# Patient Record
Sex: Female | Born: 1965 | Hispanic: No | Marital: Married | Smoking: Never smoker
Health system: Southern US, Community
[De-identification: ages and names within clinical notes are randomized; demographics above are authoritative.]

## PROBLEM LIST (undated history)

## (undated) DIAGNOSIS — M1990 Unspecified osteoarthritis, unspecified site: Secondary | ICD-10-CM

## (undated) DIAGNOSIS — I10 Essential (primary) hypertension: Secondary | ICD-10-CM

## (undated) HISTORY — PX: BREAST SURGERY: SHX581

---

## 2002-08-21 ENCOUNTER — Other Ambulatory Visit: Admission: RE | Admit: 2002-08-21 | Discharge: 2002-08-21 | Payer: Self-pay | Admitting: Internal Medicine

## 2002-11-27 ENCOUNTER — Encounter: Payer: Self-pay | Admitting: Family Medicine

## 2002-11-27 ENCOUNTER — Encounter: Admission: RE | Admit: 2002-11-27 | Discharge: 2002-11-27 | Payer: Self-pay | Admitting: Family Medicine

## 2003-02-23 ENCOUNTER — Encounter: Admission: RE | Admit: 2003-02-23 | Discharge: 2003-02-23 | Payer: Self-pay | Admitting: Family Medicine

## 2009-03-28 ENCOUNTER — Emergency Department (HOSPITAL_BASED_OUTPATIENT_CLINIC_OR_DEPARTMENT_OTHER): Admission: EM | Admit: 2009-03-28 | Discharge: 2009-03-28 | Payer: Self-pay | Admitting: Emergency Medicine

## 2010-02-26 ENCOUNTER — Encounter: Payer: Self-pay | Admitting: Family Medicine

## 2010-04-29 LAB — RAPID STREP SCREEN (MED CTR MEBANE ONLY): Streptococcus, Group A Screen (Direct): POSITIVE — AB

## 2010-05-30 ENCOUNTER — Other Ambulatory Visit: Payer: Self-pay | Admitting: Family Medicine

## 2010-05-30 ENCOUNTER — Other Ambulatory Visit (HOSPITAL_COMMUNITY)
Admission: RE | Admit: 2010-05-30 | Discharge: 2010-05-30 | Disposition: A | Discharge status: Home / Self Care | Payer: 59 | Source: Ambulatory Visit | Attending: Family Medicine | Admitting: Family Medicine

## 2010-05-30 DIAGNOSIS — Z124 Encounter for screening for malignant neoplasm of cervix: Secondary | ICD-10-CM | POA: Insufficient documentation

## 2010-05-30 DIAGNOSIS — Z1159 Encounter for screening for other viral diseases: Secondary | ICD-10-CM | POA: Insufficient documentation

## 2013-10-22 ENCOUNTER — Other Ambulatory Visit: Payer: Self-pay

## 2013-10-22 DIAGNOSIS — Z1231 Encounter for screening mammogram for malignant neoplasm of breast: Secondary | ICD-10-CM

## 2013-10-22 DIAGNOSIS — Z9889 Other specified postprocedural states: Secondary | ICD-10-CM

## 2013-10-23 ENCOUNTER — Ambulatory Visit
Admission: RE | Admit: 2013-10-23 | Discharge: 2013-10-23 | Disposition: A | Discharge status: Home / Self Care | Payer: 59 | Source: Ambulatory Visit

## 2013-10-23 DIAGNOSIS — Z9889 Other specified postprocedural states: Secondary | ICD-10-CM

## 2013-10-23 DIAGNOSIS — Z1231 Encounter for screening mammogram for malignant neoplasm of breast: Secondary | ICD-10-CM

## 2013-10-28 ENCOUNTER — Other Ambulatory Visit: Payer: Self-pay | Admitting: Family Medicine

## 2013-10-28 DIAGNOSIS — R928 Other abnormal and inconclusive findings on diagnostic imaging of breast: Secondary | ICD-10-CM

## 2013-11-07 ENCOUNTER — Ambulatory Visit
Admission: RE | Admit: 2013-11-07 | Discharge: 2013-11-07 | Disposition: A | Discharge status: Home / Self Care | Payer: 59 | Source: Ambulatory Visit | Attending: Family Medicine | Admitting: Family Medicine

## 2013-11-07 DIAGNOSIS — R928 Other abnormal and inconclusive findings on diagnostic imaging of breast: Secondary | ICD-10-CM

## 2015-03-14 IMAGING — US US BREAST*L* LIMITED INC AXILLA
1 series · 4 of 4 positions shown · non-contrast
Comparison: 10/23/2013

CLINICAL DATA: Possible mass left breast identified on recent
screening mammogram. Prior to 10/23/2013 the patient was present
mammogram was over 10 years ago and is no longer available for
comparison. History of bilateral breast reduction in 1888.

EXAM:
DIGITAL DIAGNOSTIC  LEFT MAMMOGRAM
ULTRASOUND LEFT BREAST

[Series 1: superficial breast · 4 of 4 slices shown]
[im 1/4]
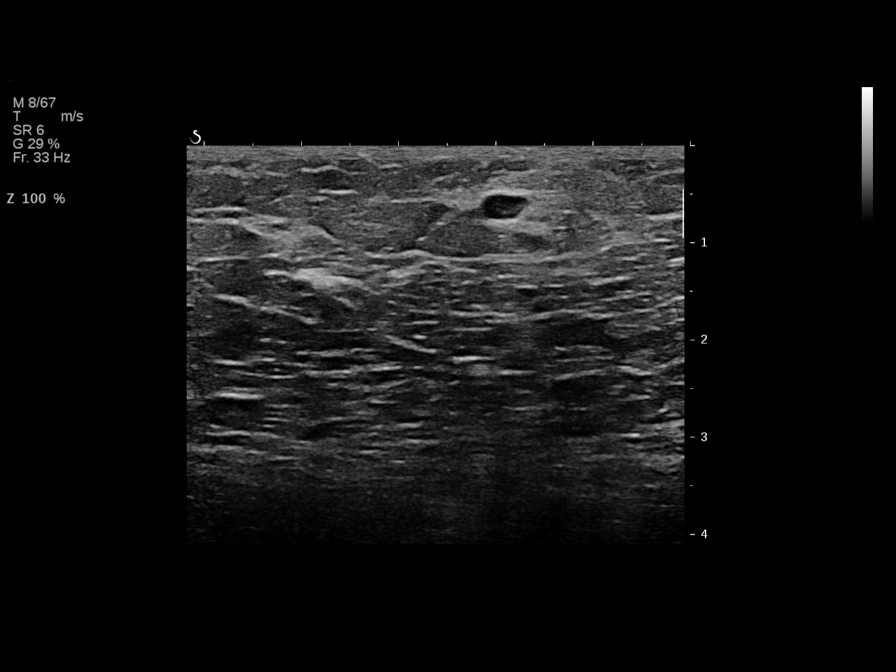
[im 2/4]
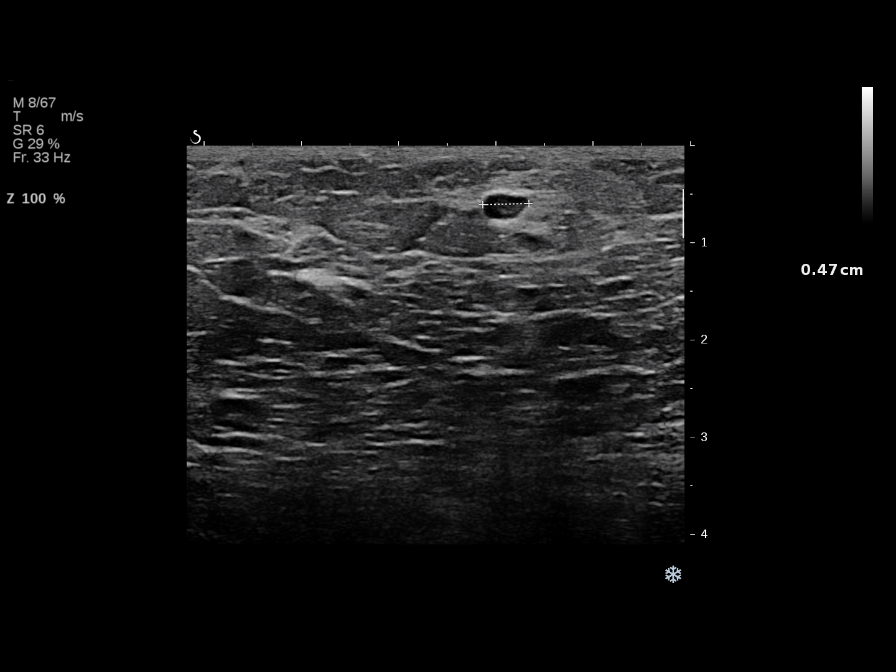
[im 3/4]
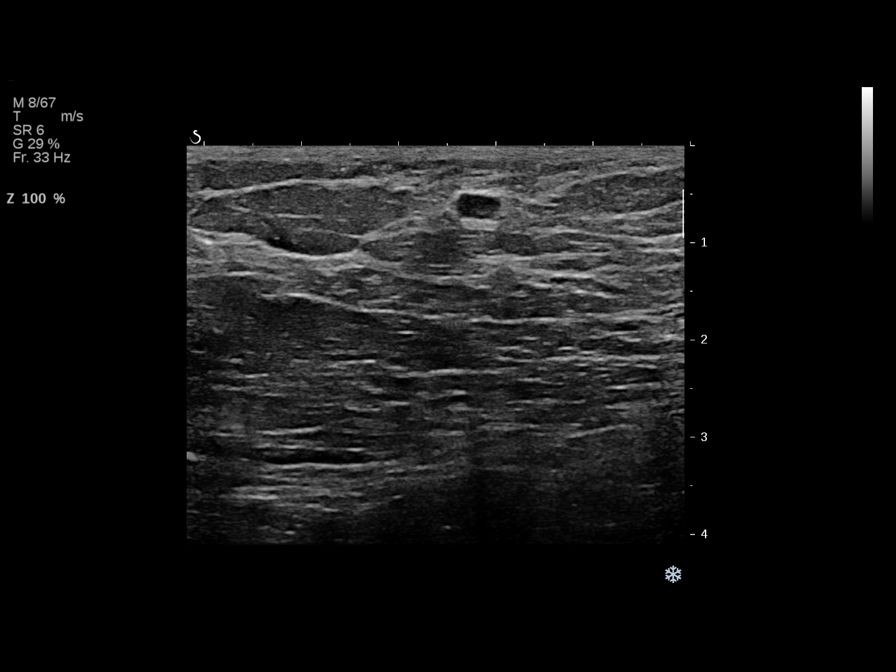
[im 4/4]
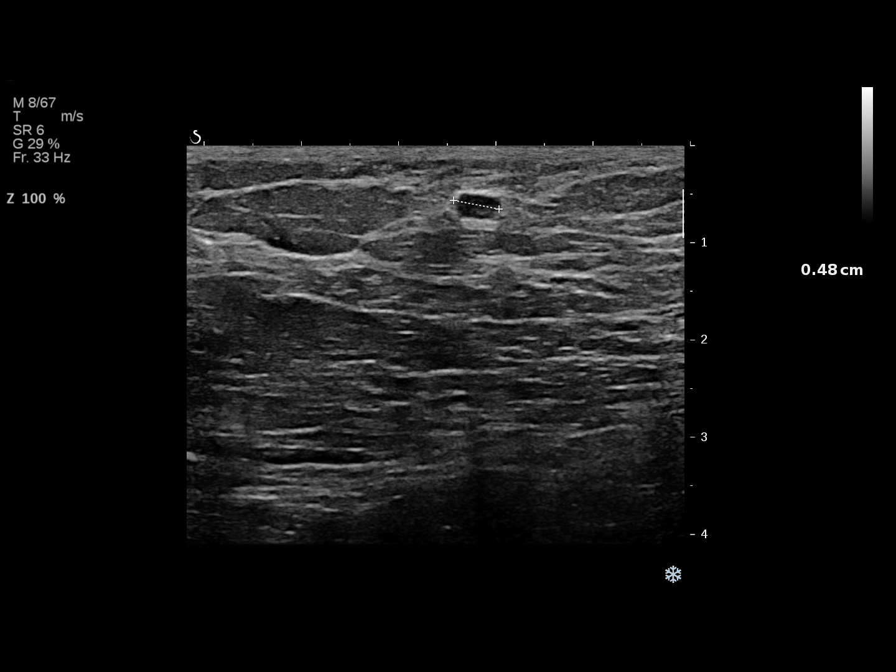

[4 of 4 positions shown; findings below may reference images not displayed]

ACR Breast Density Category b: There are scattered areas of
fibroglandular density.
FINDINGS: Focal spot compression views of the outer left breast show a
probable island of breast tissue. Small benign intramammary lymph
node is noted in this region.

On physical exam, no mass is palpated in the outer left breast.

Ultrasound is performed, showing normal predominately fatty breast
parenchyma in the outer left breast. No suspicious mass is seen. 5
mm intramammary lymph node is noted in the 3 o'clock region of the
left breast approximately 9 cm from the nipple.
IMPRESSION: No evidence of malignancy in the left breast. Benign intramammary
lymph node noted.

RECOMMENDATION:
Screening mammogram in one year.(Code:C1-O-DVS)

I have discussed the findings and recommendations with the patient.
Results were also provided in writing at the conclusion of the
visit. If applicable, a reminder letter will be sent to the patient
regarding the next appointment.

BI-RADS CATEGORY  2: Benign.

## 2015-03-14 IMAGING — MG MM DIGITAL DIAGNOSTIC UNILAT L
2 series · 2 of 2 positions shown · non-contrast
Comparison: 10/23/2013

CLINICAL DATA: Possible mass left breast identified on recent
screening mammogram. Prior to 10/23/2013 the patient was present
mammogram was over 10 years ago and is no longer available for
comparison. History of bilateral breast reduction in 1888.

EXAM:
DIGITAL DIAGNOSTIC  LEFT MAMMOGRAM
ULTRASOUND LEFT BREAST

[L MLO]
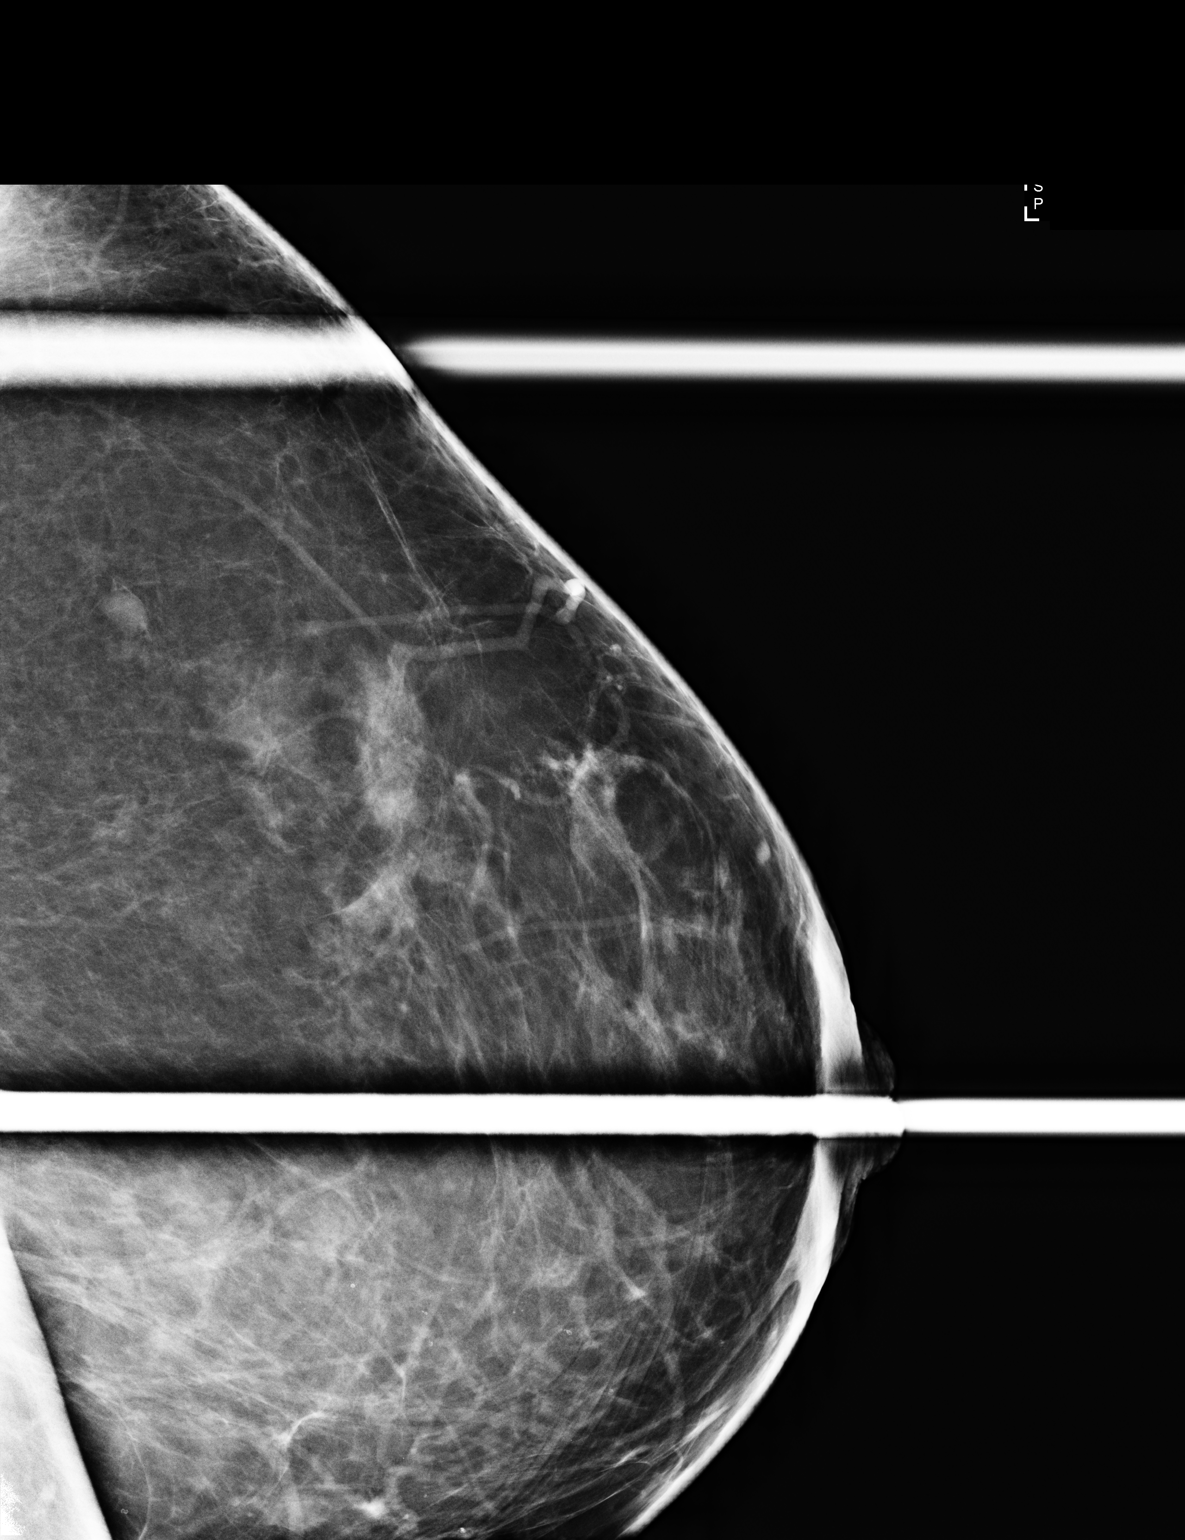

[L CC]
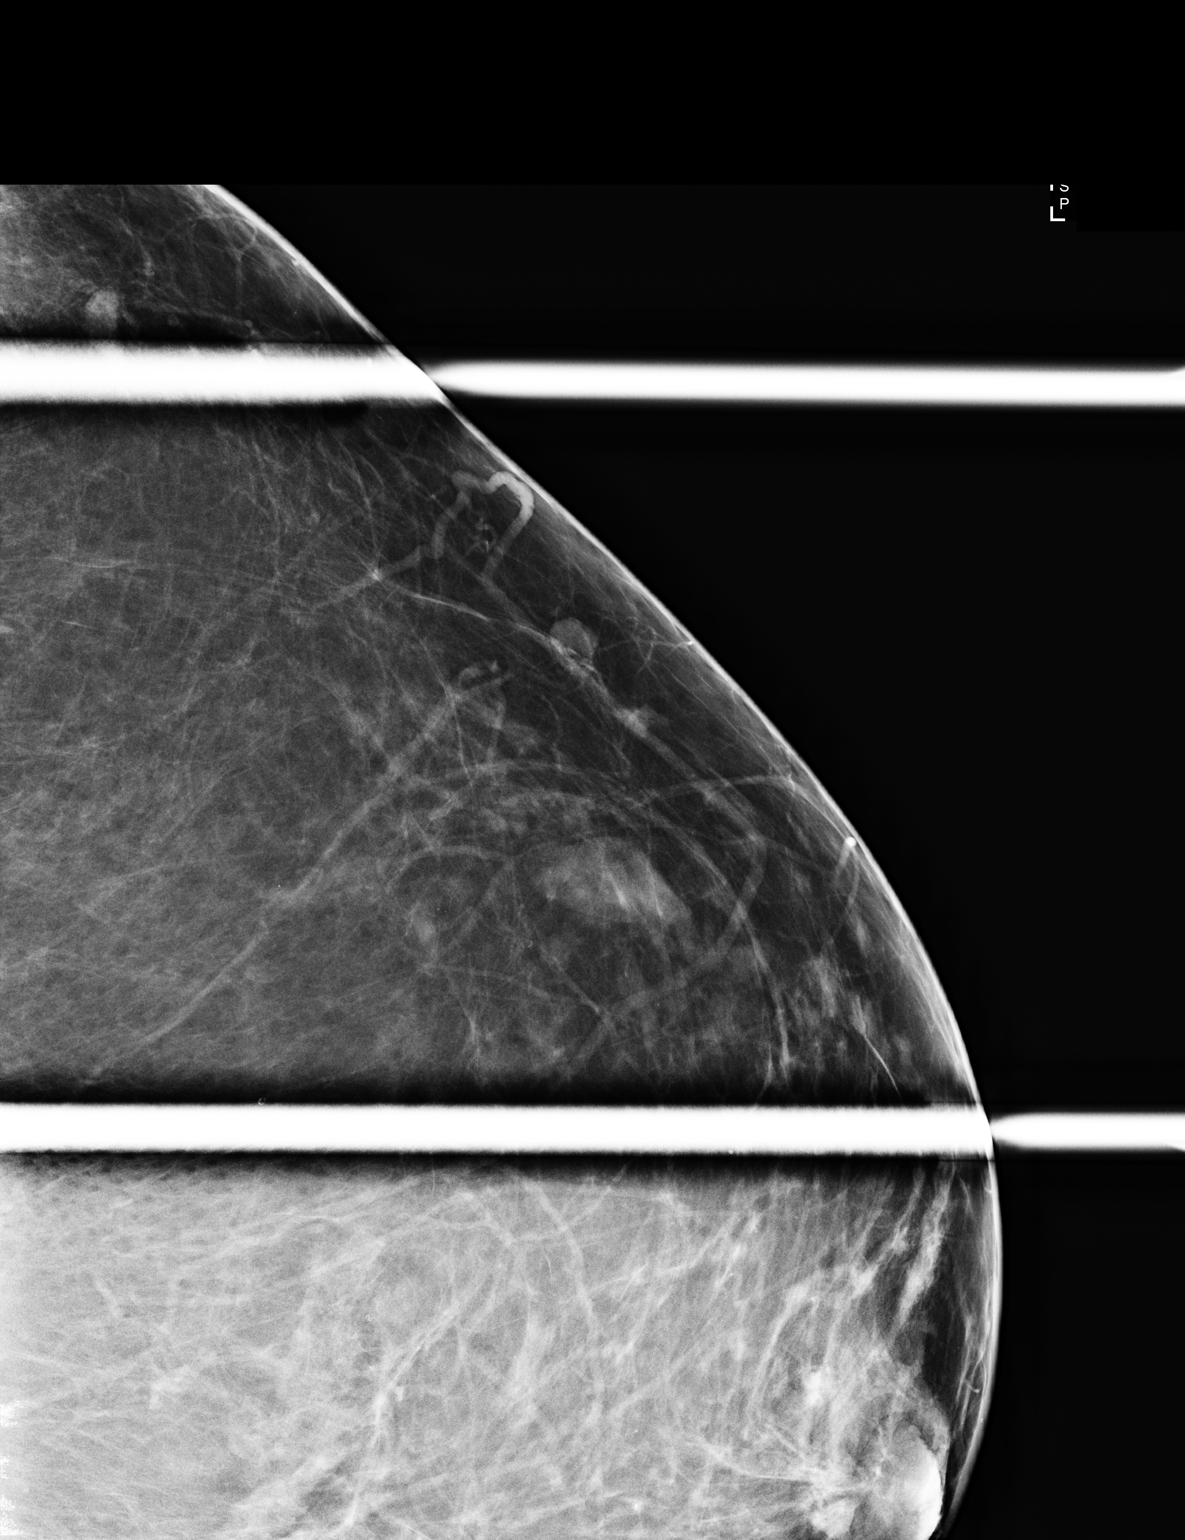

[2 of 2 positions shown; findings below may reference images not displayed]

ACR Breast Density Category b: There are scattered areas of
fibroglandular density.
FINDINGS: Focal spot compression views of the outer left breast show a
probable island of breast tissue. Small benign intramammary lymph
node is noted in this region.

On physical exam, no mass is palpated in the outer left breast.

Ultrasound is performed, showing normal predominately fatty breast
parenchyma in the outer left breast. No suspicious mass is seen. 5
mm intramammary lymph node is noted in the 3 o'clock region of the
left breast approximately 9 cm from the nipple.
IMPRESSION: No evidence of malignancy in the left breast. Benign intramammary
lymph node noted.

RECOMMENDATION:
Screening mammogram in one year.(Code:C1-O-DVS)

I have discussed the findings and recommendations with the patient.
Results were also provided in writing at the conclusion of the
visit. If applicable, a reminder letter will be sent to the patient
regarding the next appointment.

BI-RADS CATEGORY  2: Benign.

## 2018-07-25 ENCOUNTER — Ambulatory Visit: Payer: Self-pay | Admitting: Family Medicine

## 2018-07-25 NOTE — Progress Notes (Deleted)
  Katrina Harris - 53 y.o. female MRN [id number]  Date of birth: [birth year]  SUBJECTIVE:  Including CC & ROS.  No chief complaint on file.   Katrina Harris is a 53 y.o. female that is  ***.  ***   Review of Systems  HISTORY: Past Medical, Surgical, Social, and Family History Reviewed & Updated per EMR.   Pertinent Historical Findings include:  No past medical history on file.  *** The histories are not reviewed yet. Please review them in the "History" navigator section and refresh this [place].  Not on File  No family history on file.   Social History   Socioeconomic History  . Marital status: Married    Spouse name: Not on file  . Number of children: Not on file  . Years of education: Not on file  . Highest education level: Not on file  Occupational History  . Not on file  Social Needs  . Financial resource strain: Not on file  . Food insecurity    Worry: Not on file    Inability: Not on file  . Transportation needs    Medical: Not on file    Non-medical: Not on file  Tobacco Use  . Smoking status: Not on file  Substance and Sexual Activity  . Alcohol use: Not on file  . Drug use: Not on file  . Sexual activity: Not on file  Lifestyle  . Physical activity    Days per week: Not on file    Minutes per session: Not on file  . Stress: Not on file  Relationships  . Social Herbalist on phone: Not on file    Gets together: Not on file    Attends religious service: Not on file    Active member of club or organization: Not on file    Attends meetings of clubs or organizations: Not on file    Relationship status: Not on file  . Intimate partner violence    Fear of current or ex partner: Not on file    Emotionally abused: Not on file    Physically abused: Not on file    Forced sexual activity: Not on file  Other Topics Concern  . Not on file  Social History Narrative  . Not on file     PHYSICAL EXAM:  VS: There were no vitals taken for this visit.  Physical Exam Gen: NAD, alert, cooperative with exam, well-appearing ENT: normal lips, normal nasal mucosa,  Eye: normal EOM, normal conjunctiva and lids CV:  no edema, +2 pedal pulses   Resp: no accessory muscle use, non-labored,  GI: no masses or tenderness, no hernia  Skin: no rashes, no areas of induration  Neuro: normal tone, normal sensation to touch Psych:  normal insight, alert and oriented MSK:  ***      ASSESSMENT & PLAN:   No problem-specific Assessment & Plan notes found for this encounter.

## 2018-07-26 ENCOUNTER — Encounter: Payer: Self-pay | Admitting: Orthopedic Surgery

## 2018-07-26 ENCOUNTER — Ambulatory Visit: Payer: Self-pay

## 2018-07-26 ENCOUNTER — Other Ambulatory Visit: Payer: Self-pay

## 2018-07-26 ENCOUNTER — Ambulatory Visit (INDEPENDENT_AMBULATORY_CARE_PROVIDER_SITE_OTHER): Payer: No Typology Code available for payment source | Admitting: Orthopedic Surgery

## 2018-07-26 DIAGNOSIS — M25562 Pain in left knee: Secondary | ICD-10-CM | POA: Diagnosis not present

## 2018-07-26 DIAGNOSIS — M1712 Unilateral primary osteoarthritis, left knee: Secondary | ICD-10-CM | POA: Diagnosis not present

## 2018-07-26 NOTE — Progress Notes (Signed)
Office Visit Note   Patient: Katrina CousinsRose Harris           Date of Birth: 08/13/1965           MRN: [id number] Visit Date: 07/26/2018 Requested by: No referring provider defined for this encounter. PCP: Patient, No Pcp Per  Subjective: Chief Complaint  Patient presents with  . Left Knee - Pain    HPI: Katrina DupreRose is a patient with left knee pain.  She has had pain since January but is been worse of the last week.  Reports some locking and popping and it will occasionally wake her from sleep at night.  She went to her primary care provider and the radiographs showed osteoarthritis.  That is confirmed on her radiographs from today.  An injection was tried without much relief.  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  Been taking Tylenol and Aleve for the problem.  She has been walking recently for exercise.  She likes to do the elliptical at the gym.  Right knee is doing well.              ROS: All systems reviewed are negative as they relate to the chief complaint within the history of present illness.  Patient denies  fevers or chills.   Assessment & Plan: Visit Diagnoses:  1. Left knee pain, unspecified chronicity   2. Arthritis of left knee     Plan: Impression is left knee medial compartment arthritis with generally global pain.  She has tried a cortisone injection without much relief.  I would favor preapproval for gel injection.  I think she will need knee replacement at sometime in the future but she is on the young side and I think it will be likely at least a year or 2.  Non-loadbearing quad strengthening exercises such as stationary bike as well as leg extension exercises are discussed.  All questions answered.  I will see her back in about 2 or 3 weeks for the gel injection.  This patient is diagnosed with osteoarthritis of the knee(s).    Radiographs show evidence of joint space narrowing, osteophytes, subchondral sclerosis and/or subchondral cysts.  This patient has knee pain  which interferes with functional and activities of daily living.    This patient has experienced inadequate response, adverse effects and/or intolerance with conservative treatments such as acetaminophen, NSAIDS, topical creams, physical therapy or regular exercise, knee bracing and/or weight loss.   This patient has experienced inadequate response or has a contraindication to intra articular steroid injections for at least 3 months.   This patient is not scheduled to have a total knee replacement within 6 months of starting treatment with viscosupplementation.   Follow-Up Instructions: No follow-ups on file.   Orders:  Orders Placed This Encounter  Procedures  . XR Knee 1-2 Views Left   No orders of the defined types were placed in this encounter.     Procedures: No procedures performed   Clinical Data: No additional findings.  Objective: Vital Signs: There were no vitals taken for this visit.  Physical Exam:   Constitutional: Patient appears well-developed HEENT:  Head: Normocephalic Eyes:EOM are normal Neck: Normal range of motion Cardiovascular: Normal rate Pulmonary/chest: Effort normal Neurologic: Patient is alert Skin: Skin is warm Psychiatric: Patient has normal mood and affect    Ortho Exam: Ortho exam demonstrates normal gait alignment.  Patient does have an effusion in the left knee no effusion right knee.  Collateral crucial ligaments are stable  in that left knee.  Extensor mechanism is intact and she has full extension to about 115 degrees.  Pedal pulses palpable.  No groin pain with internal X rotation of the leg.  Fairly mild patellofemoral crepitus bilaterally. Specialty Comments:  No specialty comments available.  Imaging: No results found.   PMFS History: There are no active problems to display for this patient.  History reviewed. No pertinent past medical history.  History reviewed. No pertinent family history.  History reviewed. No  pertinent surgical history. Social History   Occupational History  . Not on file  Tobacco Use  . Smoking status: Not on file  Substance and Sexual Activity  . Alcohol use: Not on file  . Drug use: Not on file  . Sexual activity: Not on file

## 2018-07-29 ENCOUNTER — Telehealth: Payer: Self-pay | Admitting: Orthopedic Surgery

## 2018-07-29 NOTE — Telephone Encounter (Signed)
Please advise thanks.

## 2018-07-29 NOTE — Telephone Encounter (Signed)
Patient called asked if she can get something stronger for the  Pain in her left knee. The number to contact patient is [phone] or [phone]

## 2018-07-30 ENCOUNTER — Telehealth: Payer: Self-pay

## 2018-07-30 MED ORDER — TRAMADOL HCL 50 MG PO TABS: ORAL_TABLET | ORAL | 0 refills | Status: DC

## 2018-07-30 NOTE — Telephone Encounter (Signed)
Called to pharmacy. Patient advised done.  

## 2018-07-30 NOTE — Telephone Encounter (Signed)
Need prior auth for gel injections

## 2018-07-30 NOTE — Telephone Encounter (Signed)
Oik for tramadol 1 po q 8  - 12 # 45 pls call thc

## 2018-08-02 NOTE — Telephone Encounter (Signed)
Printed demographics for submission.  

## 2018-08-14 ENCOUNTER — Telehealth: Payer: Self-pay

## 2018-08-14 NOTE — Telephone Encounter (Signed)
Submitted for pharmacy benefits due to not being able to buy & bill for Durolane, left knee.

## 2018-08-19 ENCOUNTER — Telehealth: Payer: Self-pay | Admitting: Orthopedic Surgery

## 2018-08-19 ENCOUNTER — Telehealth: Payer: Self-pay

## 2018-08-19 NOTE — Telephone Encounter (Signed)
Patient left vm. I called her back in regards to the gell injection and the status of it. Read April J notes and advised she has tried to seek pymt for the inj through pharmacy and not covered on 7/8. Advised today she tried per notes through Medical benefits. Advised patient someone will reach out to her as soon as we hear something back.  Patient understood.

## 2018-08-19 NOTE — Telephone Encounter (Signed)
Submitted for Euflexxa, left knee due to injection not being covered by pharmacy benefits for Durolane.

## 2018-08-22 ENCOUNTER — Telehealth: Payer: Self-pay

## 2018-08-22 NOTE — Telephone Encounter (Signed)
Called and left a VM for patient to CB to schedule an appointment with Dr. Marlou Sa for gel injection.  Approved for Euflexxa series, left knee. [place] This is Out of Network, but is covered at 100% of the allowable amount. Co-pay of $160.00 required No PA required  Called to verify that Dr. Marlou Sa was Out of Network.  Per [place] with [employer] through [facility], Dr. Marlou Sa is out of Network.

## 2018-09-02 ENCOUNTER — Ambulatory Visit (INDEPENDENT_AMBULATORY_CARE_PROVIDER_SITE_OTHER): Payer: No Typology Code available for payment source | Admitting: Orthopedic Surgery

## 2018-09-02 ENCOUNTER — Encounter: Payer: Self-pay | Admitting: Orthopedic Surgery

## 2018-09-02 DIAGNOSIS — M1712 Unilateral primary osteoarthritis, left knee: Secondary | ICD-10-CM

## 2018-09-05 ENCOUNTER — Encounter: Payer: Self-pay | Admitting: Orthopedic Surgery

## 2018-09-05 DIAGNOSIS — M1712 Unilateral primary osteoarthritis, left knee: Secondary | ICD-10-CM | POA: Diagnosis not present

## 2018-09-05 MED ORDER — SODIUM HYALURONATE (VISCOSUP) 20 MG/2ML IX SOSY
20.0000 mg | PREFILLED_SYRINGE | INTRA_ARTICULAR | Status: AC | PRN
  Administered 2018-09-05: 20 mg via INTRA_ARTICULAR

## 2018-09-05 MED ORDER — LIDOCAINE HCL 1 % IJ SOLN
5.0000 mL | INTRAMUSCULAR | Status: AC | PRN
  Administered 2018-09-05: 5 mL

## 2018-09-05 NOTE — Progress Notes (Signed)
   Procedure Note  Patient: Katrina Harris             Date of Birth: Jan 04, 1966           MRN: [id number]             Visit Date: 09/02/2018  Procedures: Visit Diagnoses:  1. Arthritis of left knee     Large Joint Inj: L knee on 09/05/2018 10:49 PM Indications: pain, joint swelling and diagnostic evaluation Details: 18 G 1.5 in needle, superolateral approach  Arthrogram: No  Medications: 5 mL lidocaine 1 %; 20 mg Sodium Hyaluronate 20 MG/2ML Outcome: tolerated well, no immediate complications Procedure, treatment alternatives, risks and benefits explained, specific risks discussed. Consent was given by the patient. Immediately prior to procedure a time out was called to verify the correct patient, procedure, equipment, support staff and site/side marked as required. Patient was prepped and draped in the usual sterile fashion.     Lurena Nida This patient is diagnosed with osteoarthritis of the knee(s).    Radiographs show evidence of joint space narrowing, osteophytes, subchondral sclerosis and/or subchondral cysts.  This patient has knee pain which interferes with functional and activities of daily living.    This patient has experienced inadequate response, adverse effects and/or intolerance with conservative treatments such as acetaminophen, NSAIDS, topical creams, physical therapy or regular exercise, knee bracing and/or weight loss.   This patient has experienced inadequate response or has a contraindication to intra articular steroid injections for at least 3 months.   This patient is not scheduled to have a total knee replacement within 6 months of starting treatment with viscosupplementation.

## 2018-09-11 ENCOUNTER — Other Ambulatory Visit: Payer: Self-pay

## 2018-09-11 ENCOUNTER — Ambulatory Visit (INDEPENDENT_AMBULATORY_CARE_PROVIDER_SITE_OTHER): Payer: No Typology Code available for payment source | Admitting: Orthopedic Surgery

## 2018-09-11 ENCOUNTER — Encounter: Payer: Self-pay | Admitting: Orthopedic Surgery

## 2018-09-11 DIAGNOSIS — M1712 Unilateral primary osteoarthritis, left knee: Secondary | ICD-10-CM | POA: Diagnosis not present

## 2018-09-11 MED ORDER — SODIUM HYALURONATE (VISCOSUP) 20 MG/2ML IX SOSY
20.0000 mg | PREFILLED_SYRINGE | INTRA_ARTICULAR | Status: AC | PRN
  Administered 2018-09-11: 20 mg via INTRA_ARTICULAR

## 2018-09-11 MED ORDER — LIDOCAINE HCL 1 % IJ SOLN
5.0000 mL | INTRAMUSCULAR | Status: AC | PRN
  Administered 2018-09-11: 5 mL

## 2018-09-11 NOTE — Progress Notes (Signed)
   Procedure Note  Patient: Wrenna Saks             Date of Birth: 27-May-1965           MRN: [id number]             Visit Date: 09/11/2018  Procedures: Visit Diagnoses:  1. Arthritis of left knee     Large Joint Inj: L knee on 09/11/2018 4:52 PM Indications: pain, joint swelling and diagnostic evaluation Details: 18 G 1.5 in needle, superolateral approach  Arthrogram: No  Medications: 5 mL lidocaine 1 %; 20 mg Sodium Hyaluronate 20 MG/2ML Outcome: tolerated well, no immediate complications Procedure, treatment alternatives, risks and benefits explained, specific risks discussed. Consent was given by the patient. Immediately prior to procedure a time out was called to verify the correct patient, procedure, equipment, support staff and site/side marked as required. Patient was prepped and draped in the usual sterile fashion.

## 2018-09-18 ENCOUNTER — Encounter: Payer: Self-pay | Admitting: Orthopedic Surgery

## 2018-09-18 ENCOUNTER — Other Ambulatory Visit: Payer: Self-pay

## 2018-09-18 ENCOUNTER — Ambulatory Visit (INDEPENDENT_AMBULATORY_CARE_PROVIDER_SITE_OTHER): Payer: No Typology Code available for payment source | Admitting: Orthopedic Surgery

## 2018-09-18 DIAGNOSIS — M1712 Unilateral primary osteoarthritis, left knee: Secondary | ICD-10-CM

## 2018-09-20 ENCOUNTER — Encounter: Payer: Self-pay | Admitting: Orthopedic Surgery

## 2018-09-20 DIAGNOSIS — M1712 Unilateral primary osteoarthritis, left knee: Secondary | ICD-10-CM | POA: Diagnosis not present

## 2018-09-20 MED ORDER — SODIUM HYALURONATE (VISCOSUP) 20 MG/2ML IX SOSY
20.0000 mg | PREFILLED_SYRINGE | INTRA_ARTICULAR | Status: AC | PRN
  Administered 2018-09-20: 18:00:00 20 mg via INTRA_ARTICULAR

## 2018-09-20 MED ORDER — LIDOCAINE HCL 1 % IJ SOLN
5.0000 mL | INTRAMUSCULAR | Status: AC | PRN
  Administered 2018-09-20: 5 mL

## 2018-09-20 NOTE — Progress Notes (Signed)
   Procedure Note  Patient: Katrina Harris             Date of Birth: 1965/08/09           MRN: [id number]             Visit Date: 09/18/2018  Procedures: Visit Diagnoses: No diagnosis found.  Large Joint Inj: L knee on 09/20/2018 6:19 PM Indications: pain, joint swelling and diagnostic evaluation Details: 18 G 1.5 in needle, superolateral approach  Arthrogram: No  Medications: 5 mL lidocaine 1 %; 20 mg Sodium Hyaluronate 20 MG/2ML Outcome: tolerated well, no immediate complications Procedure, treatment alternatives, risks and benefits explained, specific risks discussed. Consent was given by the patient. Immediately prior to procedure a time out was called to verify the correct patient, procedure, equipment, support staff and site/side marked as required. Patient was prepped and draped in the usual sterile fashion.

## 2019-03-24 ENCOUNTER — Other Ambulatory Visit: Payer: Self-pay | Admitting: Orthopedic Surgery

## 2019-03-24 NOTE — Telephone Encounter (Signed)
Ok to rf? 

## 2019-09-20 ENCOUNTER — Emergency Department (HOSPITAL_BASED_OUTPATIENT_CLINIC_OR_DEPARTMENT_OTHER): Payer: No Typology Code available for payment source

## 2019-09-20 ENCOUNTER — Other Ambulatory Visit: Payer: Self-pay

## 2019-09-20 ENCOUNTER — Encounter (HOSPITAL_BASED_OUTPATIENT_CLINIC_OR_DEPARTMENT_OTHER): Payer: Self-pay | Admitting: Emergency Medicine

## 2019-09-20 ENCOUNTER — Emergency Department (HOSPITAL_BASED_OUTPATIENT_CLINIC_OR_DEPARTMENT_OTHER)
Admission: EM | Admit: 2019-09-20 | Discharge: 2019-09-20 | Disposition: A | Discharge status: Home / Self Care | Payer: No Typology Code available for payment source | Attending: Emergency Medicine | Admitting: Emergency Medicine

## 2019-09-20 DIAGNOSIS — R059 Cough, unspecified: Secondary | ICD-10-CM

## 2019-09-20 DIAGNOSIS — J189 Pneumonia, unspecified organism: Secondary | ICD-10-CM | POA: Diagnosis not present

## 2019-09-20 DIAGNOSIS — Z79899 Other long term (current) drug therapy: Secondary | ICD-10-CM | POA: Diagnosis not present

## 2019-09-20 DIAGNOSIS — Z20822 Contact with and (suspected) exposure to covid-19: Secondary | ICD-10-CM | POA: Insufficient documentation

## 2019-09-20 DIAGNOSIS — R05 Cough: Secondary | ICD-10-CM

## 2019-09-20 LAB — GROUP A STREP BY PCR: Group A Strep by PCR: NOT DETECTED

## 2019-09-20 LAB — SARS CORONAVIRUS 2 BY RT PCR (HOSPITAL ORDER, PERFORMED IN ~~LOC~~ HOSPITAL LAB): SARS Coronavirus 2: NEGATIVE

## 2019-09-20 MED ORDER — BENZONATATE 100 MG PO CAPS
100.0000 mg | ORAL_CAPSULE | Freq: Three times a day (TID) | ORAL | 0 refills | Status: DC
Start: 2019-09-20 — End: 2020-02-08

## 2019-09-20 MED ORDER — AZITHROMYCIN 250 MG PO TABS
ORAL_TABLET | ORAL | 0 refills | Status: DC
Start: 2019-09-20 — End: 2020-02-08

## 2019-09-20 NOTE — ED Triage Notes (Signed)
Cough and sore throat x 4 days. Cough is keeping her up at night.

## 2019-09-20 NOTE — ED Provider Notes (Signed)
[facility] EMERGENCY DEPARTMENT Provider Note  CSN: [id number] Arrival date & time: 09/20/19 1210    History Chief Complaint  Patient presents with  . Cough  . Sore Throat    HPI  Katrina Harris is a 54 y.o. female reports 4 days of scratchy throat and dry cough. She has not had a fever. Daughter has had similar symptoms and tested negative for Covid. Patient denies any fever, no N/V/D. She has had one Covid vaccine, due for second in 2 days. She has been taking OTC meds with minimal improvement. Having trouble sleeping at night.    History reviewed. No pertinent past medical history.  History reviewed. No pertinent surgical history.  No family history on file.  Social History   Tobacco Use  . Smoking status: Unknown If Ever Smoked  . Smokeless tobacco: Never Used  Substance Use Topics  . Alcohol use: Not on file  . Drug use: Not on file     Home Medications Prior to Admission medications   Medication Sig Start Date End Date Taking? Authorizing Provider  azithromycin (ZITHROMAX) 250 MG tablet Z-pak Use as directed 09/20/19   Pollyann Savoy, MD  benzonatate (TESSALON) 100 MG capsule Take 1 capsule (100 mg total) by mouth every 8 (eight) hours. 09/20/19   Pollyann Savoy, MD  traMADol Janean Sark) 50 MG tablet 1 po q 8-12 hrs prn pain 07/30/18   Cammy Copa, MD     Allergies    Penicillins   Review of Systems   Review of Systems A comprehensive review of systems was completed and negative except as noted in HPI.    Physical Exam BP (!) 165/96 (BP Location: Left Arm)   Pulse 77   Temp 99.6 F (37.6 C) (Oral)   Resp 16   Ht 5\' 3"  (1.6 m)   Wt 114.8 kg   SpO2 97%   BMI 44.82 kg/m   Physical Exam Vitals and nursing note reviewed.  Constitutional:      Appearance: Normal appearance.  HENT:     Head: Normocephalic and atraumatic.     Nose: Nose normal.     Mouth/Throat:     Mouth: Mucous membranes are moist.     Pharynx: No  pharyngeal swelling or posterior oropharyngeal erythema.     Tonsils: No tonsillar exudate.  Eyes:     Extraocular Movements: Extraocular movements intact.     Conjunctiva/sclera: Conjunctivae normal.  Cardiovascular:     Rate and Rhythm: Normal rate.  Pulmonary:     Effort: Pulmonary effort is normal.     Breath sounds: Normal breath sounds.  Abdominal:     General: Abdomen is flat.     Palpations: Abdomen is soft.     Tenderness: There is no abdominal tenderness.  Musculoskeletal:        General: No swelling. Normal range of motion.     Cervical back: Neck supple.  Skin:    General: Skin is warm and dry.  Neurological:     General: No focal deficit present.     Mental Status: She is alert.  Psychiatric:        Mood and Affect: Mood normal.      ED Results / Procedures / Treatments   Labs (all labs ordered are listed, but only abnormal results are displayed) Labs Reviewed  GROUP A STREP BY PCR  SARS CORONAVIRUS 2 BY RT PCR (HOSPITAL ORDER, PERFORMED IN [facility])    EKG None  Radiology DG Chest Portable 1 View  Result Date: 09/20/2019 CLINICAL DATA:  Cough and congestion for 4 days. EXAM: PORTABLE CHEST 1 VIEW COMPARISON:  None. FINDINGS: Normal cardiac and mediastinal contours. Patchy opacities within the lung bases bilaterally. No pleural effusion or pneumothorax. IMPRESSION: Bibasilar opacities may represent atelectasis or infection. Electronically Signed   By: Annia Belt M.D.   On: 09/20/2019 15:29    Procedures Procedures  Medications Ordered in the ED Medications - No data to display   MDM Rules/Calculators/A&P MDM Patient with symptoms of viral URI, although consider strep or CAP. Check strep, CXR and Covid. She is otherwise well appearing. Vitals unremarkable.  ED Course  I have reviewed the triage vital signs and the nursing notes.  Pertinent labs & imaging results that were available during my care of the patient were reviewed by  me and considered in my medical decision making (see chart for details).  Clinical Course as of Sep 19 1653  Sat Sep 20, 2019  1651 Patient's strep and Covid are neg. CXR with basilar infiltrates vs atelectasis. Will d/c home with Rx for Zithromax, Tessalon, PCP followup.    [CS]    Clinical Course User Index [CS] Pollyann Savoy, MD    Final Clinical Impression(s) / ED Diagnoses Final diagnoses:  Cough  Community acquired pneumonia, unspecified laterality    Rx / DC Orders ED Discharge Orders         Ordered    azithromycin (ZITHROMAX) 250 MG tablet     Discontinue  Reprint     09/20/19 1654    benzonatate (TESSALON) 100 MG capsule  Every 8 hours     Discontinue  Reprint     09/20/19 1654           Pollyann Savoy, MD 09/20/19 1655

## 2020-02-07 ENCOUNTER — Other Ambulatory Visit: Payer: Self-pay

## 2020-02-07 ENCOUNTER — Encounter (HOSPITAL_BASED_OUTPATIENT_CLINIC_OR_DEPARTMENT_OTHER): Payer: Self-pay | Admitting: Emergency Medicine

## 2020-02-07 DIAGNOSIS — R059 Cough, unspecified: Secondary | ICD-10-CM | POA: Diagnosis present

## 2020-02-07 DIAGNOSIS — R079 Chest pain, unspecified: Secondary | ICD-10-CM | POA: Insufficient documentation

## 2020-02-07 DIAGNOSIS — R5381 Other malaise: Secondary | ICD-10-CM | POA: Diagnosis not present

## 2020-02-07 DIAGNOSIS — R0981 Nasal congestion: Secondary | ICD-10-CM | POA: Diagnosis not present

## 2020-02-07 DIAGNOSIS — R509 Fever, unspecified: Secondary | ICD-10-CM | POA: Diagnosis not present

## 2020-02-07 DIAGNOSIS — Z20822 Contact with and (suspected) exposure to covid-19: Secondary | ICD-10-CM | POA: Insufficient documentation

## 2020-02-07 DIAGNOSIS — I10 Essential (primary) hypertension: Secondary | ICD-10-CM | POA: Diagnosis not present

## 2020-02-07 NOTE — ED Triage Notes (Signed)
Pt reports exposure to COVID; pt reports being vaccinated; pt now reports cough, stuffy and runny nose and fever; pt took ibuprofen PTA two hours

## 2020-02-08 ENCOUNTER — Emergency Department (HOSPITAL_BASED_OUTPATIENT_CLINIC_OR_DEPARTMENT_OTHER)
Admission: EM | Admit: 2020-02-08 | Discharge: 2020-02-08 | Disposition: A | Discharge status: Home / Self Care | Payer: No Typology Code available for payment source | Attending: Emergency Medicine | Admitting: Emergency Medicine

## 2020-02-08 DIAGNOSIS — Z20822 Contact with and (suspected) exposure to covid-19: Secondary | ICD-10-CM

## 2020-02-08 HISTORY — DX: Unspecified osteoarthritis, unspecified site: M19.90

## 2020-02-08 HISTORY — DX: Essential (primary) hypertension: I10

## 2020-02-08 LAB — SARS CORONAVIRUS 2 (TAT 6-24 HRS): SARS Coronavirus 2: NEGATIVE

## 2020-02-08 NOTE — ED Provider Notes (Signed)
   [facility] Provider Note: Lowella Dell, MD, FACEP  CSN: [id number] MRN: [id number] ARRIVAL: 02/07/20 at 2241 ROOM: MHFT2/MHFT2   CHIEF COMPLAINT  Cough   HISTORY OF PRESENT ILLNESS  02/08/20 4:18 AM Katrina Harris is a 55 y.o. female with 5 days of Covid-like symptoms.  Specifically she has had fever to 100.4, scratchy throat, nasal congestion, cough, chest pain with cough and general malaise.  She is treated her pain and fever with ibuprofen with improvement.  She has had no loss of taste or smell although her taste has been bland.  She has had no nausea, vomiting or diarrhea.  She has been vaccinated against Covid.   Past Medical History:  Diagnosis Date  . Arthritis   . Hypertension     Past Surgical History:  Procedure Laterality Date  . BREAST SURGERY    . CESAREAN SECTION      No family history on file.  Social History   Tobacco Use  . Smoking status: Never Smoker  . Smokeless tobacco: Never Used  Substance Use Topics  . Alcohol use: Yes    Comment: ooc  . Drug use: Never    Prior to Admission medications   Not on File    Allergies Penicillins   REVIEW OF SYSTEMS  Negative except as noted here or in the History of Present Illness.   PHYSICAL EXAMINATION  Initial Vital Signs Blood pressure (!) 170/85, pulse (!) 101, temperature 98.7 F (37.1 C), temperature source Oral, resp. rate 18, height 5\' 3"  (1.6 m), weight 113.4 kg, SpO2 96 %.  Examination General: Well-developed, well-nourished female in no acute distress; appearance consistent with age of record HENT: normocephalic; atraumatic; no pharyngeal erythema or exudate Eyes: pupils equal, round and reactive to light; extraocular muscles intact Neck: supple Heart: regular rate and rhythm Lungs: clear to auscultation bilaterally Abdomen: soft; nondistended; nontender; bowel sounds present Extremities: No deformity; full range of motion; pulses normal Neurologic: Awake, alert and  oriented; motor function intact in all extremities and symmetric; no facial droop Skin: Warm and dry Psychiatric: Normal mood and affect   RESULTS  Summary of this visit's results, reviewed and interpreted by myself:   EKG Interpretation  Date/Time:    Ventricular Rate:    PR Interval:    QRS Duration:   QT Interval:    QTC Calculation:   R Axis:     Text Interpretation:        Laboratory Studies: No results found for this or any previous visit (from the past 24 hour(s)). Imaging Studies: No results found.  ED COURSE and MDM  Nursing notes, initial and subsequent vitals signs, including pulse oximetry, reviewed and interpreted by myself.  Vitals:   02/07/20 2249 02/07/20 2251 02/08/20 0417  BP: (!) 175/92  (!) 170/85  Pulse: 98  (!) 101  Resp: 17  18  Temp: 98.7 F (37.1 C)    TempSrc: Oral    SpO2: 98%  96%  Weight:  113.4 kg   Height:  5\' 3"  (1.6 m)    Medications - No data to display  Patient's presentation is consistent with COVID-19.  We will test her and provide her with a copy of the CDC's latest quarantine guidelines.  PROCEDURES  Procedures   ED DIAGNOSES     ICD-10-CM   1. Suspected COVID-19 virus infection  Z20.822        Katrina Harris, 04/07/20, MD 02/08/20 [phone]

## 2021-01-24 IMAGING — DX DG CHEST 1V PORT
1 series · 1 of 1 positions shown · non-contrast
Comparison: None.

CLINICAL DATA: Cough and congestion for 4 days.

EXAM:
PORTABLE CHEST 1 VIEW

[chest ap]
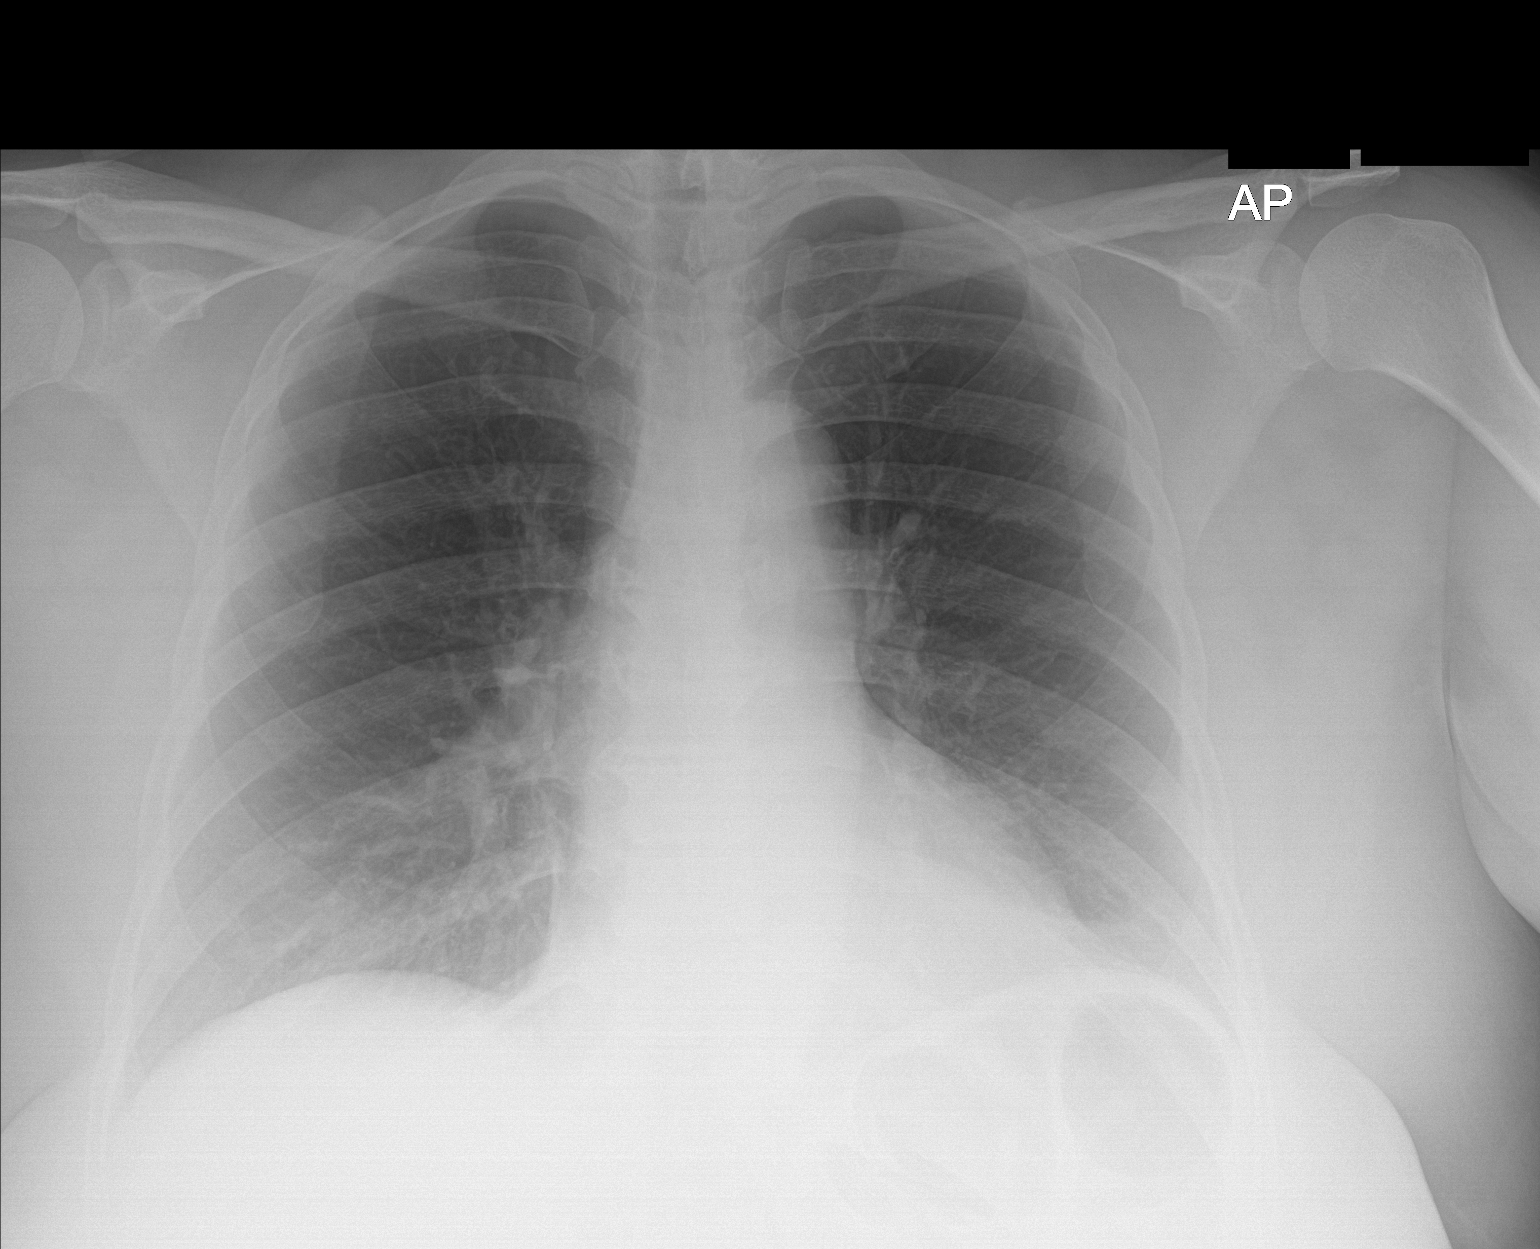

[1 of 1 positions shown; findings below may reference images not displayed]

FINDINGS: Normal cardiac and mediastinal contours. Patchy opacities within the
lung bases bilaterally. No pleural effusion or pneumothorax.
IMPRESSION: Bibasilar opacities may represent atelectasis or infection.

## 2021-10-09 ENCOUNTER — Encounter (HOSPITAL_BASED_OUTPATIENT_CLINIC_OR_DEPARTMENT_OTHER): Payer: Self-pay | Admitting: Emergency Medicine

## 2021-10-09 ENCOUNTER — Other Ambulatory Visit: Payer: Self-pay

## 2021-10-09 ENCOUNTER — Emergency Department (HOSPITAL_BASED_OUTPATIENT_CLINIC_OR_DEPARTMENT_OTHER)
Admission: EM | Admit: 2021-10-09 | Discharge: 2021-10-09 | Disposition: A | Discharge status: Home / Self Care | Payer: No Typology Code available for payment source | Attending: Emergency Medicine | Admitting: Emergency Medicine

## 2021-10-09 DIAGNOSIS — I10 Essential (primary) hypertension: Secondary | ICD-10-CM | POA: Insufficient documentation

## 2021-10-09 DIAGNOSIS — F1290 Cannabis use, unspecified, uncomplicated: Secondary | ICD-10-CM | POA: Insufficient documentation

## 2021-10-09 DIAGNOSIS — E876 Hypokalemia: Secondary | ICD-10-CM

## 2021-10-09 LAB — CBC WITH DIFFERENTIAL/PLATELET
Abs Immature Granulocytes: 0.04 10*3/uL (ref 0.00–0.07)
Basophils Absolute: 0.1 10*3/uL (ref 0.0–0.1)
Basophils Relative: 1 %
Eosinophils Absolute: 0.1 10*3/uL (ref 0.0–0.5)
Eosinophils Relative: 1 %
HCT: 37.3 % (ref 36.0–46.0)
Hemoglobin: 12.6 g/dL (ref 12.0–15.0)
Immature Granulocytes: 1 %
Lymphocytes Relative: 36 %
Lymphs Abs: 3.1 10*3/uL (ref 0.7–4.0)
MCH: 31.4 pg (ref 26.0–34.0)
MCHC: 33.8 g/dL (ref 30.0–36.0)
MCV: 93 fL (ref 80.0–100.0)
Monocytes Absolute: 0.5 10*3/uL (ref 0.1–1.0)
Monocytes Relative: 6 %
Neutro Abs: 4.6 10*3/uL (ref 1.7–7.7)
Neutrophils Relative %: 55 %
Platelets: 325 10*3/uL (ref 150–400)
RBC: 4.01 MIL/uL (ref 3.87–5.11)
RDW: 12.9 % (ref 11.5–15.5)
WBC: 8.4 10*3/uL (ref 4.0–10.5)
nRBC: 0 % (ref 0.0–0.2)

## 2021-10-09 LAB — BASIC METABOLIC PANEL
Anion gap: 8 (ref 5–15)
BUN: 20 mg/dL (ref 6–20)
CO2: 27 mmol/L (ref 22–32)
Calcium: 8.5 mg/dL — ABNORMAL LOW (ref 8.9–10.3)
Chloride: 103 mmol/L (ref 98–111)
Creatinine, Ser: 1.1 mg/dL — ABNORMAL HIGH (ref 0.44–1.00)
GFR, Estimated: 59 mL/min — ABNORMAL LOW (ref >60)
Glucose, Bld: 180 mg/dL — ABNORMAL HIGH (ref 70–99)
Potassium: 2.7 mmol/L — CL (ref 3.5–5.1)
Sodium: 138 mmol/L (ref 135–145)

## 2021-10-09 MED ORDER — SODIUM CHLORIDE 0.9 % IV BOLUS
1000.0000 mL | Freq: Once | INTRAVENOUS | Status: AC
  Administered 2021-10-09: 1000 mL via INTRAVENOUS

## 2021-10-09 MED ORDER — HYDROXYZINE HCL 25 MG PO TABS
25.0000 mg | ORAL_TABLET | Freq: Once | ORAL | Status: AC
  Administered 2021-10-09: 25 mg via ORAL
  Filled 2021-10-09: qty 1

## 2021-10-09 MED ORDER — POTASSIUM CHLORIDE CRYS ER 20 MEQ PO TBCR: 20.0000 meq | EXTENDED_RELEASE_TABLET | Freq: Two times a day (BID) | ORAL | 0 refills | Status: AC

## 2021-10-09 MED ORDER — POTASSIUM CHLORIDE CRYS ER 20 MEQ PO TBCR
40.0000 meq | EXTENDED_RELEASE_TABLET | Freq: Once | ORAL | Status: AC
  Administered 2021-10-09: 40 meq via ORAL
  Filled 2021-10-09: qty 2

## 2021-10-09 MED ORDER — ONDANSETRON HCL 4 MG/2ML IJ SOLN
4.0000 mg | Freq: Once | INTRAMUSCULAR | Status: AC
  Administered 2021-10-09: 4 mg via INTRAVENOUS
  Filled 2021-10-09: qty 2

## 2021-10-09 NOTE — ED Notes (Signed)
Pt noted to throw up immediately after atarax admin. Pt projectile vomiting into trash can. Towels given to family for floor and shoes. Pt will receive IV antiemetic

## 2021-10-09 NOTE — Discharge Instructions (Signed)
Begin taking potassium as prescribed.  Follow-up with your primary doctor or return to the ER if you experience additional problems.

## 2021-10-09 NOTE — ED Notes (Signed)
EDP and Primary RN aware of K 2.7.

## 2021-10-09 NOTE — ED Provider Notes (Signed)
[facility] EMERGENCY DEPARTMENT Provider Note   CSN: [id number] Arrival date & time: 10/09/21  8182     History  Chief Complaint  Patient presents with   Ingestion    Katrina Harris is a 56 y.o. female.  Patient is a 56 year old female with history of hypertension.  She presents accompanied by her husband for evaluation of nervousness and anxiousness.  Patient apparently took a THC gummy which was given to her by her husband approximately 90 minutes prior to presentation.  She has been anxious, jittery, and feeling unwell.  She denies difficulty breathing or chest pain.  She denies other complaints.  The history is provided by the patient.       Home Medications Prior to Admission medications   Not on File      Allergies    Penicillins    Review of Systems   Review of Systems  All other systems reviewed and are negative.   Physical Exam Updated Vital Signs BP (!) 169/83   Pulse (!) 109   Temp (!) 97.5 F (36.4 C) (Oral)   Resp 20   Ht 5\' 3"  (1.6 m)   Wt 113.4 kg   SpO2 96%   BMI 44.29 kg/m  Physical Exam Vitals and nursing note reviewed.  Constitutional:      General: She is not in acute distress.    Appearance: She is well-developed. She is not diaphoretic.     Comments: Patient appears anxious and tremulous.  HENT:     Head: Normocephalic and atraumatic.  Cardiovascular:     Rate and Rhythm: Normal rate and regular rhythm.     Heart sounds: No murmur heard.    No friction rub. No gallop.  Pulmonary:     Effort: Pulmonary effort is normal. No respiratory distress.     Breath sounds: Normal breath sounds. No wheezing.  Abdominal:     General: Bowel sounds are normal. There is no distension.     Palpations: Abdomen is soft.     Tenderness: There is no abdominal tenderness.  Musculoskeletal:        General: Normal range of motion.     Cervical back: Normal range of motion and neck supple.  Skin:    General: Skin is warm and dry.   Neurological:     General: No focal deficit present.     Mental Status: She is alert and oriented to person, place, and time.     ED Results / Procedures / Treatments   Labs (all labs ordered are listed, but only abnormal results are displayed) Labs Reviewed - No data to display  EKG None  Radiology No results found.  Procedures Procedures    Medications Ordered in ED Medications  hydrOXYzine (ATARAX) tablet 25 mg (has no administration in time range)    ED Course/ Medical Decision Making/ A&P  Patient presents here with complaints of anxiety, jitteriness, and feeling generally unwell after ingesting an edible THC gummy.  She arrives here with stable vital signs, but is tremulous and seems very anxious.  Patient was given hydroxyzine, then promptly vomited.  I then ordered IV fluids and laboratory studies along with Zofran.  These have resulted and are unremarkable with the exception of a potassium of 2.7.  This was replaced orally.  Patient has been observed in the ER for nearly 3 hours and symptoms have pretty much resolved.  She states she feels much better and I feel can safely be discharged.  Final Clinical  Impression(s) / ED Diagnoses Final diagnoses:  None    Rx / DC Orders ED Discharge Orders     None         Geoffery Lyons, MD 10/09/21 [id number]

## 2021-10-09 NOTE — ED Triage Notes (Signed)
Pt took a 100 mg marijuana gummy ~2 hours ago. C/o anxiety and dry mouth.

## 2022-05-22 ENCOUNTER — Encounter: Payer: Self-pay | Admitting: *Deleted

## 2023-11-22 ENCOUNTER — Other Ambulatory Visit: Payer: Self-pay

## 2023-11-22 ENCOUNTER — Encounter (HOSPITAL_BASED_OUTPATIENT_CLINIC_OR_DEPARTMENT_OTHER): Payer: Self-pay | Admitting: Emergency Medicine

## 2023-11-22 DIAGNOSIS — R21 Rash and other nonspecific skin eruption: Secondary | ICD-10-CM | POA: Insufficient documentation

## 2023-11-22 NOTE — ED Triage Notes (Signed)
 Pt c/o full body pruritic rash, started locally on BL forearms this AM, spread to full body this afternoon.   Denies known allergen exposure, new products.

## 2023-11-23 ENCOUNTER — Emergency Department (HOSPITAL_BASED_OUTPATIENT_CLINIC_OR_DEPARTMENT_OTHER)
Admission: EM | Admit: 2023-11-23 | Discharge: 2023-11-23 | Disposition: A | Discharge status: Home / Self Care | Attending: Emergency Medicine | Admitting: Emergency Medicine

## 2023-11-23 DIAGNOSIS — R21 Rash and other nonspecific skin eruption: Secondary | ICD-10-CM

## 2023-11-23 MED ORDER — PREDNISONE 50 MG PO TABS
60.0000 mg | ORAL_TABLET | Freq: Once | ORAL | Status: AC
  Administered 2023-11-23: 60 mg via ORAL
  Filled 2023-11-23: qty 1

## 2023-11-23 MED ORDER — PREDNISONE 10 MG (21) PO TBPK: ORAL_TABLET | ORAL | 0 refills | Status: AC

## 2023-11-23 MED ORDER — DIPHENHYDRAMINE HCL 25 MG PO CAPS
25.0000 mg | ORAL_CAPSULE | Freq: Once | ORAL | Status: AC
  Administered 2023-11-23: 25 mg via ORAL
  Filled 2023-11-23: qty 1

## 2023-11-23 NOTE — ED Provider Notes (Signed)
 [place] [facility] AT [facility]  Provider Note  CSN: [id number] Arrival date & time: 11/22/23 2233  History Chief Complaint  Patient presents with   Rash    Katrina Harris is a 58 y.o. female reports onset of an itchy rash earlier in the day, started on arms and spread to her trunk and legs as well. No tongue, lip swelling. No new soaps, lotions, etc but she did buy bamboo bedsheets recently.    Home Medications Prior to Admission medications   Medication Sig Start Date End Date Taking? Authorizing Provider  predniSONE (STERAPRED UNI-PAK 21 TAB) 10 MG (21) TBPK tablet 10mg  Tabs, 6 day taper. Use as directed 11/23/23  Yes Roselyn Carlin NOVAK, MD  potassium chloride  SA (KLOR-CON  M) 20 MEQ tablet Take 1 tablet (20 mEq total) by mouth 2 (two) times daily. 10/09/21   Geroldine Berg, MD     Allergies    Penicillins   Review of Systems   Review of Systems Please see HPI for pertinent positives and negatives  Physical Exam BP (!) 163/92 (BP Location: Right Arm)   Pulse 82   Temp 98.4 F (36.9 C) (Oral)   Resp 18   Ht 5' 3 (1.6 m)   Wt 113.4 kg   SpO2 100%   BMI 44.29 kg/m   Physical Exam Vitals and nursing note reviewed.  Constitutional:      Appearance: Normal appearance.  HENT:     Head: Normocephalic and atraumatic.     Nose: Nose normal.     Mouth/Throat:     Mouth: Mucous membranes are moist.  Eyes:     Extraocular Movements: Extraocular movements intact.     Conjunctiva/sclera: Conjunctivae normal.  Cardiovascular:     Rate and Rhythm: Normal rate.  Pulmonary:     Effort: Pulmonary effort is normal.     Breath sounds: Normal breath sounds. No stridor.  Abdominal:     General: Abdomen is flat.     Palpations: Abdomen is soft.     Tenderness: There is no abdominal tenderness.  Musculoskeletal:        General: No swelling. Normal range of motion.     Cervical back: Neck supple.  Skin:    General: Skin is warm and dry.     Findings:  Rash (diffuse dermititis rash, spares face) present.  Neurological:     General: No focal deficit present.     Mental Status: She is alert.  Psychiatric:        Mood and Affect: Mood normal.     ED Results / Procedures / Treatments   EKG None  Procedures Procedures  Medications Ordered in the ED Medications  diphenhydrAMINE (BENADRYL) capsule 25 mg (has no administration in time range)  predniSONE (DELTASONE) tablet 60 mg (has no administration in time range)    Initial Impression and Plan  Patient here with a dermatitis rash of unclear etiology, will treat with antihistamines, prednisone and PCP follow up, RTED for any other concerns.    ED Course       MDM Rules/Calculators/A&P Medical Decision Making Problems Addressed: Rash: acute illness or injury  Risk Prescription drug management.     Final Clinical Impression(s) / ED Diagnoses Final diagnoses:  Rash    Rx / DC Orders ED Discharge Orders          Ordered    predniSONE (STERAPRED UNI-PAK 21 TAB) 10 MG (21) TBPK tablet        11/23/23  0130             Roselyn Carlin NOVAK, MD 11/23/23 0130

## 2023-11-23 NOTE — ED Notes (Signed)
 Pt transferred from [facility] to ED RM 7. Assuming pt care at this time.
